# Patient Record
Sex: Female | Born: 1935 | Race: White | Hispanic: No
Health system: Southern US, Community
[De-identification: ages and names within clinical notes are randomized; demographics above are authoritative.]

---

## 2006-06-14 ENCOUNTER — Ambulatory Visit: Payer: Self-pay | Admitting: Internal Medicine

## 2007-02-17 ENCOUNTER — Emergency Department: Payer: Self-pay | Admitting: Internal Medicine

## 2007-02-17 ENCOUNTER — Emergency Department: Payer: Self-pay

## 2007-04-22 ENCOUNTER — Emergency Department: Payer: Self-pay | Admitting: Emergency Medicine

## 2007-05-19 ENCOUNTER — Emergency Department: Payer: Self-pay | Admitting: Emergency Medicine

## 2007-06-20 ENCOUNTER — Other Ambulatory Visit: Payer: Self-pay

## 2007-06-20 ENCOUNTER — Emergency Department: Payer: Self-pay | Admitting: Internal Medicine

## 2009-02-03 ENCOUNTER — Ambulatory Visit: Payer: Self-pay | Admitting: Internal Medicine

## 2009-10-02 ENCOUNTER — Emergency Department: Payer: Self-pay | Admitting: Emergency Medicine

## 2010-08-01 ENCOUNTER — Inpatient Hospital Stay: Payer: Self-pay | Admitting: Internal Medicine

## 2010-10-20 ENCOUNTER — Inpatient Hospital Stay: Payer: Self-pay | Admitting: Internal Medicine

## 2010-12-07 ENCOUNTER — Encounter: Payer: Self-pay | Admitting: Family Medicine

## 2010-12-17 ENCOUNTER — Encounter: Payer: Self-pay | Admitting: Family Medicine

## 2011-01-16 ENCOUNTER — Encounter: Payer: Self-pay | Admitting: Family Medicine

## 2011-01-21 ENCOUNTER — Encounter: Payer: Self-pay | Admitting: Family Medicine

## 2011-02-16 ENCOUNTER — Encounter: Payer: Self-pay | Admitting: Family Medicine

## 2011-03-02 ENCOUNTER — Ambulatory Visit: Payer: Self-pay | Admitting: Neurology

## 2011-03-15 ENCOUNTER — Emergency Department: Payer: Self-pay | Admitting: Emergency Medicine

## 2011-03-15 ENCOUNTER — Ambulatory Visit: Payer: Self-pay | Admitting: Unknown Physician Specialty

## 2011-03-17 ENCOUNTER — Other Ambulatory Visit: Payer: Self-pay | Admitting: Unknown Physician Specialty

## 2011-03-19 ENCOUNTER — Encounter: Payer: Self-pay | Admitting: Family Medicine

## 2011-03-19 ENCOUNTER — Ambulatory Visit: Payer: Self-pay | Admitting: Neurology

## 2011-11-22 ENCOUNTER — Ambulatory Visit: Payer: Self-pay | Admitting: Family Medicine

## 2011-11-24 ENCOUNTER — Ambulatory Visit: Payer: Self-pay | Admitting: Family Medicine

## 2012-05-21 ENCOUNTER — Ambulatory Visit: Payer: Self-pay | Admitting: Unknown Physician Specialty

## 2013-02-08 ENCOUNTER — Inpatient Hospital Stay: Payer: Self-pay | Admitting: Internal Medicine

## 2013-02-08 LAB — URINALYSIS, COMPLETE
Ketone: NEGATIVE
Leukocyte Esterase: NEGATIVE
RBC,UR: <1 /HPF (ref 0–5)
Squamous Epithelial: NONE SEEN
WBC UR: NONE SEEN /HPF (ref 0–5)

## 2013-02-08 LAB — CBC
HGB: 12.1 g/dL (ref 12.0–16.0)
MCH: 30.9 pg (ref 26.0–34.0)
MCV: 90 fL (ref 80–100)
Platelet: 202 10*3/uL (ref 150–440)
RBC: 3.91 10*6/uL (ref 3.80–5.20)
RDW: 14 % (ref 11.5–14.5)

## 2013-02-08 LAB — COMPREHENSIVE METABOLIC PANEL
Albumin: 3.3 g/dL — ABNORMAL LOW (ref 3.4–5.0)
Alkaline Phosphatase: 68 U/L (ref 50–136)
Anion Gap: 2 — ABNORMAL LOW (ref 7–16)
BUN: 6 mg/dL — ABNORMAL LOW (ref 7–18)
Co2: 34 mmol/L — ABNORMAL HIGH (ref 21–32)
EGFR (African American): >60
Osmolality: 273 (ref 275–301)
Potassium: 3.6 mmol/L (ref 3.5–5.1)
SGOT(AST): 19 U/L (ref 15–37)
SGPT (ALT): 14 U/L (ref 12–78)
Total Protein: 7.5 g/dL (ref 6.4–8.2)

## 2013-02-08 LAB — CLOSTRIDIUM DIFFICILE BY PCR

## 2013-02-08 LAB — PROTIME-INR: INR: 2.2

## 2013-02-09 LAB — CBC WITH DIFFERENTIAL/PLATELET
Basophil #: 0.1 10*3/uL (ref 0.0–0.1)
Eosinophil #: 0.5 10*3/uL (ref 0.0–0.7)
Eosinophil %: 4.2 %
HCT: 31.9 % — ABNORMAL LOW (ref 35.0–47.0)
HGB: 10.8 g/dL — ABNORMAL LOW (ref 12.0–16.0)
Lymphocyte #: 1.3 10*3/uL (ref 1.0–3.6)
Lymphocyte %: 10 %
MCH: 30.5 pg (ref 26.0–34.0)
MCHC: 33.8 g/dL (ref 32.0–36.0)
MCV: 90 fL (ref 80–100)
Neutrophil #: 9.5 10*3/uL — ABNORMAL HIGH (ref 1.4–6.5)
Neutrophil %: 76 %
Platelet: 184 10*3/uL (ref 150–440)
RDW: 13.8 % (ref 11.5–14.5)

## 2013-02-09 LAB — COMPREHENSIVE METABOLIC PANEL
Albumin: 2.7 g/dL — ABNORMAL LOW (ref 3.4–5.0)
Calcium, Total: 8.2 mg/dL — ABNORMAL LOW (ref 8.5–10.1)
Chloride: 104 mmol/L (ref 98–107)
EGFR (African American): >60
Glucose: 85 mg/dL (ref 65–99)
Osmolality: 276 (ref 275–301)
Potassium: 3.1 mmol/L — ABNORMAL LOW (ref 3.5–5.1)
SGOT(AST): 18 U/L (ref 15–37)
Sodium: 140 mmol/L (ref 136–145)
Total Protein: 6.3 g/dL — ABNORMAL LOW (ref 6.4–8.2)

## 2013-02-09 LAB — PROTIME-INR: Prothrombin Time: 23.3 secs — ABNORMAL HIGH (ref 11.5–14.7)

## 2013-02-09 LAB — MAGNESIUM: Magnesium: 1.5 mg/dL — ABNORMAL LOW

## 2013-02-10 LAB — BASIC METABOLIC PANEL WITH GFR
Anion Gap: 5 — ABNORMAL LOW
BUN: 8 mg/dL
Calcium, Total: 8 mg/dL — ABNORMAL LOW
Chloride: 104 mmol/L
Co2: 30 mmol/L
Creatinine: 0.79 mg/dL
EGFR (African American): >60
EGFR (Non-African Amer.): >60
Glucose: 114 mg/dL — ABNORMAL HIGH
Osmolality: 277
Potassium: 3 mmol/L — ABNORMAL LOW
Sodium: 139 mmol/L

## 2013-02-10 LAB — PROTIME-INR: INR: 2.2

## 2013-02-10 LAB — MAGNESIUM: Magnesium: 2.4 mg/dL

## 2013-02-11 LAB — BASIC METABOLIC PANEL
Anion Gap: 4 — ABNORMAL LOW (ref 7–16)
BUN: 10 mg/dL (ref 7–18)
Calcium, Total: 8.1 mg/dL — ABNORMAL LOW (ref 8.5–10.1)
Chloride: 107 mmol/L (ref 98–107)
EGFR (African American): >60
Osmolality: 281 (ref 275–301)
Potassium: 3.5 mmol/L (ref 3.5–5.1)
Sodium: 141 mmol/L (ref 136–145)

## 2013-02-11 LAB — PROTIME-INR
INR: 1.9
Prothrombin Time: 21.8 secs — ABNORMAL HIGH (ref 11.5–14.7)

## 2013-02-11 LAB — CBC WITH DIFFERENTIAL/PLATELET
Basophil %: 0.5 %
Eosinophil #: 0.1 10*3/uL (ref 0.0–0.7)
Eosinophil %: 0.8 %
HCT: 29.2 % — ABNORMAL LOW (ref 35.0–47.0)
HGB: 10.1 g/dL — ABNORMAL LOW (ref 12.0–16.0)
MCHC: 34.6 g/dL (ref 32.0–36.0)
MCV: 90 fL (ref 80–100)
Monocyte #: 1.5 x10 3/mm — ABNORMAL HIGH (ref 0.2–0.9)
Neutrophil #: 9.2 10*3/uL — ABNORMAL HIGH (ref 1.4–6.5)
Neutrophil %: 77.6 %
Platelet: 178 10*3/uL (ref 150–440)
RBC: 3.24 10*6/uL — ABNORMAL LOW (ref 3.80–5.20)
RDW: 14.1 % (ref 11.5–14.5)

## 2013-02-11 LAB — WBCS, STOOL

## 2013-02-12 LAB — BASIC METABOLIC PANEL
Anion Gap: 3 — ABNORMAL LOW (ref 7–16)
BUN: 10 mg/dL (ref 7–18)
Calcium, Total: 8.3 mg/dL — ABNORMAL LOW (ref 8.5–10.1)
Chloride: 105 mmol/L (ref 98–107)
Co2: 31 mmol/L (ref 21–32)
Creatinine: 0.61 mg/dL (ref 0.60–1.30)
EGFR (African American): >60
EGFR (Non-African Amer.): >60
Glucose: 108 mg/dL — ABNORMAL HIGH (ref 65–99)
Osmolality: 277 (ref 275–301)
Potassium: 3.1 mmol/L — ABNORMAL LOW (ref 3.5–5.1)

## 2013-02-12 LAB — PROTIME-INR: Prothrombin Time: 26.1 secs — ABNORMAL HIGH (ref 11.5–14.7)

## 2013-02-13 LAB — BASIC METABOLIC PANEL
Calcium, Total: 8.4 mg/dL — ABNORMAL LOW (ref 8.5–10.1)
Co2: 29 mmol/L (ref 21–32)
Creatinine: 0.62 mg/dL (ref 0.60–1.30)
EGFR (Non-African Amer.): >60
Osmolality: 277 (ref 275–301)
Potassium: 3.1 mmol/L — ABNORMAL LOW (ref 3.5–5.1)

## 2013-02-13 LAB — PROTIME-INR
INR: 2.4
Prothrombin Time: 25.4 secs — ABNORMAL HIGH (ref 11.5–14.7)

## 2013-03-08 ENCOUNTER — Emergency Department: Payer: Self-pay | Admitting: Unknown Physician Specialty

## 2013-03-08 LAB — COMPREHENSIVE METABOLIC PANEL
Albumin: 3.5 g/dL (ref 3.4–5.0)
Alkaline Phosphatase: 67 U/L (ref 50–136)
Anion Gap: 6 — ABNORMAL LOW (ref 7–16)
Calcium, Total: 9 mg/dL (ref 8.5–10.1)
Co2: 26 mmol/L (ref 21–32)
EGFR (African American): >60
EGFR (Non-African Amer.): >60
Glucose: 91 mg/dL (ref 65–99)
Potassium: 3.6 mmol/L (ref 3.5–5.1)
SGPT (ALT): 21 U/L (ref 12–78)
Sodium: 137 mmol/L (ref 136–145)
Total Protein: 7.3 g/dL (ref 6.4–8.2)

## 2013-03-08 LAB — CBC
HCT: 36.5 % (ref 35.0–47.0)
HGB: 12.4 g/dL (ref 12.0–16.0)
MCH: 30.7 pg (ref 26.0–34.0)
MCHC: 33.9 g/dL (ref 32.0–36.0)
MCV: 91 fL (ref 80–100)
Platelet: 185 10*3/uL (ref 150–440)
RBC: 4.03 10*6/uL (ref 3.80–5.20)
RDW: 14 % (ref 11.5–14.5)

## 2013-03-08 LAB — CK TOTAL AND CKMB (NOT AT ARMC): CK, Total: 34 U/L (ref 21–215)

## 2014-01-15 ENCOUNTER — Ambulatory Visit: Payer: Self-pay | Admitting: Internal Medicine

## 2014-01-18 ENCOUNTER — Inpatient Hospital Stay: Payer: Self-pay | Admitting: Internal Medicine

## 2014-01-18 LAB — URINALYSIS, COMPLETE
Bilirubin,UR: NEGATIVE
Glucose,UR: NEGATIVE mg/dL (ref 0–75)
Ketone: NEGATIVE
Leukocyte Esterase: NEGATIVE
Nitrite: NEGATIVE
PH: 5 (ref 4.5–8.0)
PROTEIN: NEGATIVE
SPECIFIC GRAVITY: 1.025 (ref 1.003–1.030)

## 2014-01-18 LAB — CBC WITH DIFFERENTIAL/PLATELET
Basophil #: 0.1 10*3/uL (ref 0.0–0.1)
Basophil %: 0.8 %
EOS ABS: 0.3 10*3/uL (ref 0.0–0.7)
EOS PCT: 2.1 %
HCT: 35.4 % (ref 35.0–47.0)
HGB: 11.8 g/dL — ABNORMAL LOW (ref 12.0–16.0)
LYMPHS PCT: 3.8 %
Lymphocyte #: 0.6 10*3/uL — ABNORMAL LOW (ref 1.0–3.6)
MCH: 31.2 pg (ref 26.0–34.0)
MCHC: 33.3 g/dL (ref 32.0–36.0)
MCV: 94 fL (ref 80–100)
MONO ABS: 1 x10 3/mm — AB (ref 0.2–0.9)
Monocyte %: 6.3 %
NEUTROS PCT: 87 %
Neutrophil #: 13.5 10*3/uL — ABNORMAL HIGH (ref 1.4–6.5)
PLATELETS: 157 10*3/uL (ref 150–440)
RBC: 3.78 10*6/uL — AB (ref 3.80–5.20)
RDW: 13.1 % (ref 11.5–14.5)
WBC: 15.5 10*3/uL — ABNORMAL HIGH (ref 3.6–11.0)

## 2014-01-18 LAB — TROPONIN I

## 2014-01-18 LAB — COMPREHENSIVE METABOLIC PANEL
ALT: 18 U/L (ref 12–78)
AST: 29 U/L (ref 15–37)
Albumin: 3.2 g/dL — ABNORMAL LOW (ref 3.4–5.0)
Alkaline Phosphatase: 66 U/L
Anion Gap: 10 (ref 7–16)
BUN: 12 mg/dL (ref 7–18)
Bilirubin,Total: 1 mg/dL (ref 0.2–1.0)
CHLORIDE: 101 mmol/L (ref 98–107)
Calcium, Total: 8.2 mg/dL — ABNORMAL LOW (ref 8.5–10.1)
Co2: 28 mmol/L (ref 21–32)
Creatinine: 0.7 mg/dL (ref 0.60–1.30)
EGFR (African American): >60
GLUCOSE: 142 mg/dL — AB (ref 65–99)
Osmolality: 280 (ref 275–301)
Potassium: 3.3 mmol/L — ABNORMAL LOW (ref 3.5–5.1)
SODIUM: 139 mmol/L (ref 136–145)
Total Protein: 6.7 g/dL (ref 6.4–8.2)

## 2014-01-18 LAB — LIPASE, BLOOD: LIPASE: 65 U/L — AB (ref 73–393)

## 2014-01-18 LAB — MAGNESIUM: Magnesium: 1.7 mg/dL — ABNORMAL LOW

## 2014-01-18 LAB — PROTIME-INR
INR: 2.2
PROTHROMBIN TIME: 23.7 s — AB (ref 11.5–14.7)

## 2014-01-18 LAB — ACETAMINOPHEN LEVEL: ACETAMINOPHEN: 18 ug/mL

## 2014-01-19 LAB — URINE CULTURE

## 2014-01-19 LAB — BASIC METABOLIC PANEL
ANION GAP: 8 (ref 7–16)
BUN: 11 mg/dL (ref 7–18)
CHLORIDE: 102 mmol/L (ref 98–107)
CREATININE: 0.79 mg/dL (ref 0.60–1.30)
Calcium, Total: 8.4 mg/dL — ABNORMAL LOW (ref 8.5–10.1)
Co2: 28 mmol/L (ref 21–32)
EGFR (African American): >60
GLUCOSE: 98 mg/dL (ref 65–99)
OSMOLALITY: 275 (ref 275–301)
POTASSIUM: 3.4 mmol/L — AB (ref 3.5–5.1)
SODIUM: 138 mmol/L (ref 136–145)

## 2014-01-19 LAB — PROTIME-INR
INR: 1.7
PROTHROMBIN TIME: 19.9 s — AB (ref 11.5–14.7)

## 2014-01-19 LAB — CBC WITH DIFFERENTIAL/PLATELET
Basophil #: 0.2 10*3/uL — ABNORMAL HIGH (ref 0.0–0.1)
Basophil %: 1.7 %
EOS ABS: 0 10*3/uL (ref 0.0–0.7)
Eosinophil %: 0.1 %
HCT: 34.3 % — ABNORMAL LOW (ref 35.0–47.0)
HGB: 11.5 g/dL — AB (ref 12.0–16.0)
LYMPHS ABS: 0.9 10*3/uL — AB (ref 1.0–3.6)
LYMPHS PCT: 6.8 %
MCH: 31.6 pg (ref 26.0–34.0)
MCHC: 33.4 g/dL (ref 32.0–36.0)
MCV: 95 fL (ref 80–100)
Monocyte #: 1 x10 3/mm — ABNORMAL HIGH (ref 0.2–0.9)
Monocyte %: 7.8 %
Neutrophil #: 10.8 10*3/uL — ABNORMAL HIGH (ref 1.4–6.5)
Neutrophil %: 83.6 %
Platelet: 164 10*3/uL (ref 150–440)
RBC: 3.63 10*6/uL — ABNORMAL LOW (ref 3.80–5.20)
RDW: 13.2 % (ref 11.5–14.5)
WBC: 12.8 10*3/uL — ABNORMAL HIGH (ref 3.6–11.0)

## 2014-01-19 LAB — DIGOXIN LEVEL: Digoxin: 0.9 ng/mL

## 2014-01-20 LAB — BASIC METABOLIC PANEL
Anion Gap: 7 (ref 7–16)
BUN: 21 mg/dL — ABNORMAL HIGH (ref 7–18)
CO2: 28 mmol/L (ref 21–32)
CREATININE: 0.94 mg/dL (ref 0.60–1.30)
Calcium, Total: 8.5 mg/dL (ref 8.5–10.1)
Chloride: 101 mmol/L (ref 98–107)
EGFR (African American): >60
GFR CALC NON AF AMER: 54 — AB
Glucose: 116 mg/dL — ABNORMAL HIGH (ref 65–99)
OSMOLALITY: 276 (ref 275–301)
Potassium: 3.3 mmol/L — ABNORMAL LOW (ref 3.5–5.1)
Sodium: 136 mmol/L (ref 136–145)

## 2014-01-20 LAB — PROTIME-INR
INR: 1.9
PROTHROMBIN TIME: 21.7 s — AB (ref 11.5–14.7)

## 2014-01-21 LAB — BASIC METABOLIC PANEL
ANION GAP: 9 (ref 7–16)
BUN: 20 mg/dL — AB (ref 7–18)
CREATININE: 1.27 mg/dL (ref 0.60–1.30)
Calcium, Total: 8.1 mg/dL — ABNORMAL LOW (ref 8.5–10.1)
Chloride: 99 mmol/L (ref 98–107)
Co2: 26 mmol/L (ref 21–32)
GFR CALC AF AMER: 44 — AB
GFR CALC NON AF AMER: 38 — AB
GLUCOSE: 98 mg/dL (ref 65–99)
Osmolality: 271 (ref 275–301)
POTASSIUM: 3.3 mmol/L — AB (ref 3.5–5.1)
Sodium: 134 mmol/L — ABNORMAL LOW (ref 136–145)

## 2014-01-21 LAB — TSH: Thyroid Stimulating Horm: 1.26 u[IU]/mL

## 2014-01-21 LAB — PROTIME-INR
INR: 2.5
Prothrombin Time: 26.5 secs — ABNORMAL HIGH (ref 11.5–14.7)

## 2014-01-21 LAB — MAGNESIUM: Magnesium: 2 mg/dL

## 2014-01-21 LAB — VANCOMYCIN, TROUGH: Vancomycin, Trough: 13 ug/mL (ref 10–20)

## 2014-01-22 LAB — BASIC METABOLIC PANEL
ANION GAP: 9 (ref 7–16)
BUN: 30 mg/dL — ABNORMAL HIGH (ref 7–18)
CALCIUM: 8.5 mg/dL (ref 8.5–10.1)
CREATININE: 1.39 mg/dL — AB (ref 0.60–1.30)
Chloride: 98 mmol/L (ref 98–107)
Co2: 26 mmol/L (ref 21–32)
EGFR (African American): 39 — ABNORMAL LOW
EGFR (Non-African Amer.): 34 — ABNORMAL LOW
GLUCOSE: 100 mg/dL — AB (ref 65–99)
Osmolality: 273 (ref 275–301)
POTASSIUM: 3.2 mmol/L — AB (ref 3.5–5.1)
SODIUM: 133 mmol/L — AB (ref 136–145)

## 2014-01-22 LAB — PROTIME-INR
INR: 2.8
PROTHROMBIN TIME: 29 s — AB (ref 11.5–14.7)

## 2014-01-23 LAB — HEMOGLOBIN: HGB: 9.7 g/dL — ABNORMAL LOW (ref 12.0–16.0)

## 2014-01-23 LAB — BASIC METABOLIC PANEL
ANION GAP: 8 (ref 7–16)
BUN: 21 mg/dL — ABNORMAL HIGH (ref 7–18)
CHLORIDE: 103 mmol/L (ref 98–107)
CREATININE: 1.33 mg/dL — AB (ref 0.60–1.30)
Calcium, Total: 8.1 mg/dL — ABNORMAL LOW (ref 8.5–10.1)
Co2: 26 mmol/L (ref 21–32)
GFR CALC AF AMER: 41 — AB
GFR CALC NON AF AMER: 36 — AB
Glucose: 82 mg/dL (ref 65–99)
Osmolality: 276 (ref 275–301)
Potassium: 3.1 mmol/L — ABNORMAL LOW (ref 3.5–5.1)
SODIUM: 137 mmol/L (ref 136–145)

## 2014-01-23 LAB — PROTIME-INR
INR: 4
PROTHROMBIN TIME: 37.7 s — AB (ref 11.5–14.7)

## 2014-01-23 LAB — CULTURE, BLOOD (SINGLE)

## 2014-01-23 LAB — VANCOMYCIN, TROUGH: Vancomycin, Trough: 20 ug/mL (ref 10–20)

## 2014-01-24 LAB — CBC WITH DIFFERENTIAL/PLATELET
BASOS ABS: 0.1 10*3/uL (ref 0.0–0.1)
Basophil %: 0.8 %
EOS PCT: 3.8 %
Eosinophil #: 0.4 10*3/uL (ref 0.0–0.7)
HCT: 29.6 % — AB (ref 35.0–47.0)
HGB: 9.9 g/dL — AB (ref 12.0–16.0)
LYMPHS ABS: 1.2 10*3/uL (ref 1.0–3.6)
Lymphocyte %: 11.2 %
MCH: 32 pg (ref 26.0–34.0)
MCHC: 33.3 g/dL (ref 32.0–36.0)
MCV: 96 fL (ref 80–100)
Monocyte #: 1 x10 3/mm — ABNORMAL HIGH (ref 0.2–0.9)
Monocyte %: 9.9 %
NEUTROS ABS: 7.7 10*3/uL — AB (ref 1.4–6.5)
NEUTROS PCT: 74.3 %
Platelet: 284 10*3/uL (ref 150–440)
RBC: 3.08 10*6/uL — AB (ref 3.80–5.20)
RDW: 13.4 % (ref 11.5–14.5)
WBC: 10.4 10*3/uL (ref 3.6–11.0)

## 2014-01-24 LAB — PROTIME-INR
INR: 4.6
Prothrombin Time: 42.1 secs — ABNORMAL HIGH (ref 11.5–14.7)

## 2014-01-24 LAB — BASIC METABOLIC PANEL
ANION GAP: 7 (ref 7–16)
BUN: 18 mg/dL (ref 7–18)
CO2: 27 mmol/L (ref 21–32)
Calcium, Total: 8.2 mg/dL — ABNORMAL LOW (ref 8.5–10.1)
Chloride: 104 mmol/L (ref 98–107)
Creatinine: 1.29 mg/dL (ref 0.60–1.30)
EGFR (African American): 43 — ABNORMAL LOW
EGFR (Non-African Amer.): 37 — ABNORMAL LOW
GLUCOSE: 90 mg/dL (ref 65–99)
OSMOLALITY: 277 (ref 275–301)
Potassium: 3.5 mmol/L (ref 3.5–5.1)
Sodium: 138 mmol/L (ref 136–145)

## 2014-01-25 LAB — BASIC METABOLIC PANEL
Anion Gap: 7 (ref 7–16)
BUN: 15 mg/dL (ref 7–18)
CALCIUM: 8.1 mg/dL — AB (ref 8.5–10.1)
CHLORIDE: 106 mmol/L (ref 98–107)
CREATININE: 1.15 mg/dL (ref 0.60–1.30)
Co2: 26 mmol/L (ref 21–32)
EGFR (Non-African Amer.): 42 — ABNORMAL LOW
GFR CALC AF AMER: 49 — AB
GLUCOSE: 79 mg/dL (ref 65–99)
OSMOLALITY: 277 (ref 275–301)
Potassium: 3.5 mmol/L (ref 3.5–5.1)
SODIUM: 139 mmol/L (ref 136–145)

## 2014-01-25 LAB — MAGNESIUM: Magnesium: 2.2 mg/dL

## 2014-01-25 LAB — PROTIME-INR
INR: 2.9
Prothrombin Time: 29.5 secs — ABNORMAL HIGH (ref 11.5–14.7)

## 2014-01-26 LAB — PROTIME-INR
INR: 2.6
Prothrombin Time: 27 secs — ABNORMAL HIGH (ref 11.5–14.7)

## 2014-01-27 LAB — BASIC METABOLIC PANEL
ANION GAP: 7 (ref 7–16)
BUN: 11 mg/dL (ref 7–18)
Calcium, Total: 8.2 mg/dL — ABNORMAL LOW (ref 8.5–10.1)
Chloride: 104 mmol/L (ref 98–107)
Co2: 27 mmol/L (ref 21–32)
Creatinine: 1.19 mg/dL (ref 0.60–1.30)
EGFR (Non-African Amer.): 41 — ABNORMAL LOW
GFR CALC AF AMER: 47 — AB
Glucose: 110 mg/dL — ABNORMAL HIGH (ref 65–99)
OSMOLALITY: 276 (ref 275–301)
Potassium: 4.3 mmol/L (ref 3.5–5.1)
SODIUM: 138 mmol/L (ref 136–145)

## 2014-01-27 LAB — CK TOTAL AND CKMB (NOT AT ARMC)
CK, Total: 20 U/L — ABNORMAL LOW
CK-MB: 1 ng/mL (ref 0.5–3.6)

## 2014-01-27 LAB — CBC WITH DIFFERENTIAL/PLATELET
BASOS PCT: 0.6 %
Basophil #: 0.1 10*3/uL (ref 0.0–0.1)
EOS ABS: 0.2 10*3/uL (ref 0.0–0.7)
EOS PCT: 1.4 %
HCT: 31.7 % — ABNORMAL LOW (ref 35.0–47.0)
HGB: 10.2 g/dL — AB (ref 12.0–16.0)
LYMPHS ABS: 0.8 10*3/uL — AB (ref 1.0–3.6)
Lymphocyte %: 5.4 %
MCH: 30.8 pg (ref 26.0–34.0)
MCHC: 32.2 g/dL (ref 32.0–36.0)
MCV: 96 fL (ref 80–100)
Monocyte #: 0.7 x10 3/mm (ref 0.2–0.9)
Monocyte %: 4.9 %
Neutrophil #: 12.3 10*3/uL — ABNORMAL HIGH (ref 1.4–6.5)
Neutrophil %: 87.7 %
PLATELETS: 370 10*3/uL (ref 150–440)
RBC: 3.32 10*6/uL — ABNORMAL LOW (ref 3.80–5.20)
RDW: 13.5 % (ref 11.5–14.5)
WBC: 14 10*3/uL — AB (ref 3.6–11.0)

## 2014-01-27 LAB — PROTIME-INR
INR: 2.2
Prothrombin Time: 23.5 secs — ABNORMAL HIGH (ref 11.5–14.7)

## 2014-01-28 LAB — PROTIME-INR
INR: 2
Prothrombin Time: 22.1 secs — ABNORMAL HIGH (ref 11.5–14.7)

## 2014-01-29 LAB — CBC WITH DIFFERENTIAL/PLATELET
Basophil #: 0.1 10*3/uL (ref 0.0–0.1)
Basophil %: 0.6 %
EOS ABS: 0.1 10*3/uL (ref 0.0–0.7)
Eosinophil %: 0.8 %
HCT: 31.2 % — ABNORMAL LOW (ref 35.0–47.0)
HGB: 10 g/dL — ABNORMAL LOW (ref 12.0–16.0)
Lymphocyte #: 0.7 10*3/uL — ABNORMAL LOW (ref 1.0–3.6)
Lymphocyte %: 5 %
MCH: 30.6 pg (ref 26.0–34.0)
MCHC: 32.2 g/dL (ref 32.0–36.0)
MCV: 95 fL (ref 80–100)
MONOS PCT: 5.4 %
Monocyte #: 0.8 x10 3/mm (ref 0.2–0.9)
NEUTROS ABS: 12.4 10*3/uL — AB (ref 1.4–6.5)
Neutrophil %: 88.2 %
Platelet: 383 10*3/uL (ref 150–440)
RBC: 3.28 10*6/uL — ABNORMAL LOW (ref 3.80–5.20)
RDW: 14.1 % (ref 11.5–14.5)
WBC: 14 10*3/uL — AB (ref 3.6–11.0)

## 2014-01-29 LAB — PROTIME-INR
INR: 2.2
Prothrombin Time: 24.2 secs — ABNORMAL HIGH (ref 11.5–14.7)

## 2014-01-29 LAB — CREATININE, SERUM
CREATININE: 1.12 mg/dL (ref 0.60–1.30)
EGFR (Non-African Amer.): 44 — ABNORMAL LOW
GFR CALC AF AMER: 51 — AB

## 2014-01-30 LAB — PROTIME-INR
INR: 2.8
Prothrombin Time: 29.1 secs — ABNORMAL HIGH (ref 11.5–14.7)

## 2014-02-15 ENCOUNTER — Ambulatory Visit: Payer: Self-pay | Admitting: Internal Medicine

## 2014-02-15 DEATH — deceased

## 2014-11-07 NOTE — Discharge Summary (Signed)
PATIENT NAME:  Brooke Brooks, Brooke Brooks MR#:  [id number] DATE OF BIRTH:  [birth year]  DATE OF ADMISSION:  02/08/2013 DATE OF DISCHARGE:  02/13/2013  DISCHARGE DIAGNOSES:  1. Diverticulitis.  2. Acute on chronic systolic congestive heart failure.  3. Chronic atrial fibrillation, on Coumadin.  4. Hypertension.  5. Electrolyte imbalance, hypokalemia.  6. Pneumonia.  7. Weakness.  8. History of stroke.   CODE STATUS: FULL CODE.   CONDITION ON DISCHARGE: Stable.   MEDICATIONS ON DISCHARGE:  1. Protonix 40 mg oral enteric coated once a day.  2. Simvastatin 20 mg once a day.  3. Trazodone 100 mg 1.5 once a day.  4. Furosemide 20 mg once a day.  5. Digoxin 250 mcg oral tablet once a day.  6. Ativan 0.5 mg 4 times a day.  7. Warfarin 2.5 mg once a day.  8. Coreg 25 mg 2 times a day.  9. Losartan 50 mg once a day.  10. Invanz 1 g IV every 24 hours for 4 days.  11. Erythromycin 250 mg oral tablet once a day for 3 days.  12. Potassium chloride 10 mEq oral tablet 2 times a day.   DIET ON DISCHARGE: Low-sodium. Advised to have clear Ensure 2 times a day, regular consistency.   TIMEFRAME TO FOLLOWUP: Within 1 to 2 weeks. Advised to follow with PMD and GI with Dr. Lutricia FeilPaul Oh and Dr. Tera MaterJim Hedrick.   HISTORY OF PRESENT ILLNESS: An 81 year old female with a history of ischemic cardiomyopathy with an ejection fraction of 35% to 45%, hypertension, coronary artery disease, fibromyalgia, severe diet intolerance. Came with a history of diverticulitis that started almost 2 weeks ago. She went to gastroenterology office and was prescribed Augmentin. She took 10 days of therapy. She completed it but still had significant symptoms, so decided to come to the ER. Had 10 out of 10 abdominal pain which was throbbing, constant and cramp-like. She still had diarrhea, so she was admitted for failure of oral therapy for need of IV antibiotic therapy for diverticulitis.   HOSPITAL COURSE AND STAY:   1. Diverticulitis: Failed  outpatient therapy. IV Pincus Sanesnvanz was started. PICC line was placed, and the patient discharged home.  2. Chronic CHF: The patient was initially not having any exacerbation of symptoms, but as IV fluids were given for her diarrhea and diverticulitis, she started having acute exacerbation, so Lasix was restarted and the patient felt better.  3. Acute respiratory failure: The patient became a little short of breath. On chest x-ray, there was evidence of pulmonary edema versus infiltrate, so erythromycin was started to cover atypical organisms on top of ertapenem that she was receiving for diverticulitis.  4. Atrial fibrillation which was chronic: She was on carvedilol, Coumadin and digoxin. We continued and remained under control.  5. Hypertension: Remained under control with carvedilol and losartan.  6. Electrolyte imbalance: Hypokalemia and hypomagnesemia which were corrected IV.  7. History of stroke: We continued Coumadin.   IMPORTANT LABORATORY RESULTS IN THE HOSPITAL: Creatinine was 0.79 on admission. There were no WBCs in the stool. INR was 1.9. C. diff stool study was negative.   TOTAL TIME SPENT ON THIS DISCHARGE: 45 minutes.   ____________________________ Hope PigeonVaibhavkumar G. Elisabeth PigeonVachhani, MD vgv:gb D: 02/17/2013 22:07:31 ET T: 02/18/2013 01:53:38 ET JOB#: [id number]  cc: Hope PigeonVaibhavkumar G. Elisabeth PigeonVachhani, MD, <Dictator> Ezzard StandingPaul Y. Bluford Kaufmannh, MD Rhona LeavensJames F. Burnett ShengHedrick, MD  Altamese DillingVAIBHAVKUMAR Khyli Swaim MD ELECTRONICALLY SIGNED 02/28/2013 1:29

## 2014-11-07 NOTE — Consult Note (Signed)
Pt seen and examined. Full consult to follow. Pt of Dr. Earnest ConroyElliott's. Treated for possible diverticulitis at home with augmentin. Initially felt better. However, after being off for few days, started to have more abd pain. No fever. Chronic diarrhea. Admitted on Fri on IV Abx. Pt allergic to cipro. Little better. No bleeding. WBC coming down. Low abd tenderness. CT shows sigmoid colitis. Pt not interested in having colonoscopy or flex sigmoidoscopy at this time even if sxs persist.Continue IV Abx. Interested in going home on PICC line/IV Abx with  home care service. If continues to feel better as WBC iimproving, reasonable to arrange that tomorrow. Dr. Mechele CollinElliott is out until Thurs. I will be out this week starting tomorrow. So, will have Dr. Shelle Ironein see patient tomorrow. Thanks.  Electronic Signatures: Lutricia Feilh, Macky Galik (MD)  (Signed on 27-Jul-14 10:19)  Authored  Last Updated: 27-Jul-14 10:19 by Lutricia Feilh, Athena Baltz (MD)

## 2014-11-07 NOTE — Consult Note (Signed)
PATIENT NAME:  Brooke Brooks, Brooke Brooks MR#:  [id number] DATE OF BIRTH:  09/28/1923  DATE OF CONSULTATION:  02/09/2013  CONSULTING PHYSICIAN:  Ezzard Standing. Bluford Kaufmann, MD  REASON FOR REFERRAL:  Diverticulitis.   HISTORY OF PRESENT ILLNESS:  The patient is an 81 year old white female with a known history of cardiomyopathy, hypertension, coronary artery disease who also has a history of stroke in the past. She was seen by Dr. Earnest Conroy office recently for possible diverticulitis and was given a 10-day course of Augmentin with relief. SHE IS ALLERGIC TO CIPRO. After she finished the Augmentin, she started having recurrent symptoms again with increasing abdominal pain but no fevers or chills. As a result, Dr. Mechele Collin told her to come into the Emergency Room for evaluation. It was decided that she will be given intravenous antibiotics for better coverage.   The patient was started on IV and Invanz. I was consulted yesterday to see the patient is morning. The patient is starting to feel better. She has chronic diarrhea which has not gotten any worse with her current bout of diverticulitis, but she does have some urinary and fecal incontinence as well.   There is less pain. She had a CT scan done that showed sigmoid colitis.  REVIEW OF SYMPTOMS:  There are no fevers or chills. There are no weight changes. There are no visual or hearing changes. There is no chest pain or palpitation. No cough. No shortness of breath. GI symptoms have been described already. There is no nausea, vomiting. No heartburn. There is no gross hematochezia or melena. The rest of the review of symptoms is noncontributory.   PAST MEDICAL HISTORY:  Notable for a history of CVA with left-sided weakness. She does have history of anxiety. She has ischemic cardiomyopathy with ejection fraction of 35% to 45%. Other history includes fibromyalgia, mitral valve prolapse, arthritis and COPD as well as atrial fibrillation. She is on chronic anticoagulation.   PAST  SURGICAL HISTORY:  Include thyroidectomy, hysterectomy cataract surgery.   ALLERGIES: SHE IS ALLERGIC TO CIPRO AND SULFA. SHE IS ALSO LACTOSE INTOLERANT.   SOCIAL HISTORY:  She does not smoke or drink. She lives with her daughter.   FAMILY HISTORY:  Notable for hypertension.   MEDICATIONS AT HOME:  Include: Coumadin daily, (Dictation Anomaly)<<MISSING TEST>>>, Zocor, Protonix, losartan, Lomotil as needed, Lasix, folic acid, digoxin, Coreg twice a day, Ativan, Tylenol.   PHYSICAL EXAMINATION: GENERAL: The patient appears to be in no acute distress. She is elderly and frail looking.  VITAL SIGNS: She is afebrile. Vital signs are stable.  CARDIAC:  Irregularly irregular rate. I did not appreciate any murmurs.  LUNGS: Clear bilaterally.  ABDOMEN: Soft. There is no hepatomegaly. There is some mild tenderness in the lower abdomen, more so on the left side.  EXTREMITIES: No clubbing, cyanosis, or edema.  NEUROLOGIC: Some left-sided weakness from CVA.  SKIN: Negative.   ASSESSMENT AND PLAN: This is a patient with persistent and recurrent abdominal pain associated with colitis seen on CT scan. She probably has persistent diverticulitis which is improving with IV antibiotics. Other possibilities include infectious colitis or ischemic colitis. I did discuss the possibility of doing a colonoscopy or a flexible sigmoidoscopy if her symptoms persist. However, at this time the patient is unwilling to agree to a procedure at this time because of her age and other comorbid illnesses.   The patient and the daughter are interested in having the patient go home soon with IV antibiotics with home care service. PICC line needs  to be placed first. If home health care can be arranged, perhaps she can even be discharged tomorrow with a full 10-day course of antibiotics at home. Certainly she needs to follow up with Dr. Mechele CollinElliott. We need to make sure the white blood cell count continues to improve before she is  discharged. If for some reason she is not able to go home, than Dr. Alycia Rossettiyan will see the patient tomorrow.   Thank you for the referral.   ____________________________ Ezzard StandingPaul Y. Bluford Kaufmannh, MD pyo:NTS D: 02/10/2013 12:38:07 ET T: 02/10/2013 13:41:12 ET JOB#: [id number]  cc: Ezzard StandingPaul Y. Bluford Kaufmannh, MD, <Dictator> Ezzard StandingPAUL Y Maurine Mowbray MD ELECTRONICALLY SIGNED 02/20/2013 8:34

## 2014-11-07 NOTE — H&P (Signed)
PATIENT NAME:  Brooke Brooks, Brooke Brooks MR#:  [id number] DATE OF BIRTH:  [birth year]  DATE OF ADMISSION:  02/08/2013  REFERRING PHYSICIAN: Cecille Amsterdam. Mayford Knife, MD  PRIMARY CARE PHYSICIAN: Mickel Crow, MD   GASTROENTEROLOGIST: Scot Jun, MD  CHIEF COMPLAINT: Left lower quadrant abdominal pain.   HISTORY OF PRESENT ILLNESS: This is a very nice 81 year old female who has a history of ischemic cardiomyopathy with an ejection fraction of 35% to 45%, hypertension, coronary artery disease, fibromyalgia, severe diet intolerance including wheat, citrus, lactose. She had a previous stroke,  anxiety. Comes in today with a history of diverticulitis that started about a month ago. She waited 2 weeks to consult to her gastroenterologist. Once she consulted, she was treated with Augmentin. Prescription was given for 10 days. She completed 10 days and still has significant symptoms, for what she is admitted for failure to treat diverticulitis as an outpatient. Her pain is located in the left lower quadrant, progressing to the right lower quadrant within the last week. Her pain intensity was 10 out of 10 at the beginning, has improved to 6 out of 10 within the last week, sore, throbbing, constant with increased gas production, cramping-like. No better with movement or bowel movements. There is nothing that really makes it better or anything that makes it worse. It is just constant at the same rate. The patient denies any fever. Denies any significant changes in her stool habits, which is always loose stool, and she has chronic urinary and fecal incontinence. The patient denies any fever, chills, changes in weight or other problems. The patient was evaluated in the ER where she had a CT scan of the abdomen showing sigmoid colon colitis with right perirectal lymphadenopathy; recommended followup with colonoscopy.   REVIEW OF SYSTEMS: A 12-system review of systems was done. CONSTITUTIONAL: Denies any fever, fatigue,  weakness, weight loss or weight gain.  EYES: No blurry vision, double vision. The patient had cataract surgery.  EARS, NOSE, THROAT: No tinnitus. No difficulty swallowing. No epistaxis. No sinus pain.  RESPIRATORY: No COPD. No shortness of breath. No cough. No wheezing.  CARDIOVASCULAR: No chest pain, orthopnea or paroxysmal nocturnal dyspnea. The patient has history of CHF, systolic, which is well-compensated.  GASTROINTESTINAL: Positive loose bowel movements all the time since her stroke. No blood in stool. Positive abdominal pain, left lower quadrant. GENITOURINARY: No dysuria or hematuria. Positive chronic incontinence. No changes in urinary frequency. GYNECOLOGIC: No breast masses.  ENDOCRINE: No polyuria, polydipsia, polyphagia, cold or heat intolerance.  HEMATOLOGIC AND LYMPHATIC: No anemia, easy bruising or swollen glands.  MUSCULOSKELETAL: No significant neck pain or back pain. No gout.  NEUROLOGIC: Previous CVA. No numbness or tingling at this moment.  PSYCHIATRIC: Positive for anxiety.   PAST MEDICAL HISTORY: 1.  CVA with left-sided weakness.  2.  Anxiety.  3.  Hyperlipidemia.  4.  CHF, systolic, with ejection fraction of 35% to 45%. 5.  Coronary artery disease.  6.  Status post non-ST elevation MI.  7.  Hypertension.  8.  Peptic ulcer disease.  9.  Fibromyalgia.  10.  Mitral valve prolapse.  11.  Osteoarthritis.  12.  COPD.  13.  Left bundle branch block.  14.  History of diverticulitis.  15.  Atrial fibrillation.  16.  Chronic anticoagulation.   PAST SURGICAL HISTORY:  1.  Thyroidectomy.  2.  Tonsillectomy.  3.  Hysterectomy.  4.  Cataract surgery.   ALLERGIES: THE PATIENT IS ALLERGIC TO CIPRO, SULFAS. SHE IS ALLERGIC TO LACTOSE.  NO WHEAT IN THE  DIET, NO DAIRY PRODUCTS, NO SPICY FOODS. NO CITRUS. THEY ALL CAUSE HER TO HAVE INTOLERANCE.   SOCIAL HISTORY: The patient lives with her daughter. She does not smoke, does not drink.   FAMILY HISTORY: Positive for  hypertension in her parents.   CURRENT MEDICATIONS: Include Coumadin 2.5 mg daily, trazodone 100 mg take 1-1/2 tablets once a day, simvastatin 20 mg daily, Protonix 40 mg once a day, losartan 50 mg twice daily, Lomotil 2.5/0.025 as needed for diarrhea, furosemide 20 mg once a day, folic acid 1 mg once daily, digoxin 250 mcg once a day, Coreg 25 mg twice daily, Ativan 0.5 mg 4 times a day as needed for agitation, Tylenol 325 mg 2 tablets 3 times a day as needed for pain.   PHYSICAL EXAMINATION: VITAL SIGNS: Blood pressure of 147/66, pulse 98, respirations 22, temperature 98.4. The patient's oxygen saturation is 95% on room air.  GENERAL: Alert and oriented x 3. No acute distress. No respiratory distress. Hemodynamically stable.  HEENT: Pupils are equal and reactive. Extraocular movements are intact. Mucosae are moist. Anicteric sclerae. Pink conjunctivae. No oral lesions. No oropharyngeal exudates. No thrush.  NECK: Supple. No JVD. No thyromegaly. No adenopathy. No carotid bruits. No rigidity.  CARDIOVASCULAR: Irregularly irregular. No murmurs, rubs or gallops. No displacement of PMI. No tenderness to palpation of anterior chest wall.  ABDOMEN: Soft, nontender, nondistended. No hepatosplenomegaly. No masses. Bowel sounds are positive.  GENITAL: Negative for external lesions. The patient is wearing a diaper.  EXTREMITIES: No edema, cyanosis or clubbing. Pulses +2. Capillary refill less than 3.  SKIN: No rashes, petechiae. No icterus.  MUSCULOSKELETAL: No significant joint deformities or joint effusions. No tenderness to palpation of ankles.  PSYCHIATRIC: No significant agitation. Alert, oriented x 3.  NEUROLOGIC: Cranial nerves II through XII intact. Strength is 5 out of 5 in all 4 extremities.  VASCULAR: Capillary refill less than 3 seconds. Pulses +2.  LYMPHATIC: Negative for lymphadenopathy in neck or supraclavicular area.   LABORATORY, DIAGNOSTIC AND RADIOLOGICAL DATA: CT abdomen and pelvis as  mentioned above: Sigmoid colon colitis/possible diverticulitis, right parenchymal lymphadenopathy; recommend followup colonoscopy to exclude malignancy. Glucose 100, creatinine 0.68, sodium 138, potassium 3.6, CO2 of 34, albumin 3.3. White count is 14,000; hemoglobin is 12. INR is 2.2. C. diff is negative. Urinalysis negative for urinary infection. Lactic acid 1.8. EKG monitor shows atrial fibrillation, rate controlled.   ASSESSMENT AND PLAN: This is a very nice 81 year old female with history of multiple medical problems including congestive heart failure (systolic), hypertension, hyperlipidemia, history of previous diverticulitis, comes with failure to treat diverticulitis outpatient.  1.  Left lower quadrant abdominal pain secondary to diverticulitis: The patient has been taking Augmentin for the last 10 days, failure to treat. In the past, she has been treated with amoxicillin. She was not able to tolerate that and it did not clear her up, and then she was changed to amoxicillin plus Flagyl and she improved. At this moment since she has been taking Augmentin, we are going to stop that and start her on Invanz. Gastroenterology consultation to be obtained to figure out course of care. At this moment, the patient is under the impression that she is going to be admitted and discharged the next day. Unfortunately with the type of medication she is taking,  it is only intravenous. If we would like to send her back home on this medication, we will have to put in a PICC line. The other option  is to add Flagyl to the Augmentin, but as per gastroenterology preference, we have done Invanz and will just follow up with them.  2.  Elevation of white blood count with systemic inflammatory response syndrome: The patient meets criteria based on elevation of heart rate, elevation of white count above 15,000. This is likely secondary to the diverticulitis/colitis. The patient is treated on Invanz.  3.  Congestive heart  failure: The patient has systolic congestive heart failure, which is chronic. At this moment it is stable, well-compensated, ejection fraction 35% to 45%, no signs of exacerbation. The patient needs some fluids because she looks dry. We are going to monitor her closely.  4.  Atrial fibrillation: Continue Coumadin. Check PT-INR in the morning.  5.  Multiple allergies to food: Try to give her diet appropriately to that.  6.  Cerebrovascular accident: Continue treatment with Coumadin, as the patient has chronic atrial fibrillation.  7.  Peptic ulcer disease: Proton pump inhibitor.  CODE STATUS: The patient is a full code.   TIME SPENT: I spent about 45 minutes with this patient.    ____________________________ Felipa Furnace, MD rsg:jm D: 02/08/2013 18:31:21 ET T: 02/08/2013 19:08:33 ET JOB#: [id number]  cc: Felipa Furnace, MD, <Dictator> Daliyah Sramek Juanda Chance MD ELECTRONICALLY SIGNED 02/10/2013 0:40

## 2014-11-08 NOTE — H&P (Signed)
PATIENT NAME:  Brooke Brooks, [name] MR#:  [id number] DATE OF BIRTH:  08/01/1923  DATE OF ADMISSION:  01/18/2014  REFERRING PHYSICIAN: Dr. York CeriseForbach  PRIMARY CARE PHYSICIAN: Dr. Burnett ShengHedrick   CHIEF COMPLAINT: Fever.   HISTORY OF PRESENT ILLNESS: An 81 year old Caucasian female with history of atrial fibrillation, history of stroke, congestive heart failure, ejection fraction of 35%, presenting with fever. Describes fever of three days' duration with subjective chills as well as generalized malaise. Finally took her temperature today and noted it to be 103.5 degrees Fahrenheit. Called her PCP with the above findings, who advised her to present to the Emergency Department. She presently has no localizing illness or symptoms. Denies any cough, shortness of breath, GI symptoms, including abdominal pain or diarrhea. Denies any urinary symptoms. However, does complain of generalized weakness, fatigue.   REVIEW OF SYSTEMS:  CONSTITUTIONAL: Positive for fevers, chills, weakness, fatigue.  EYES: Denies blurred vision, double vision, eye pain.  EARS, NOSE, THROAT: Denies tinnitus, ear pain, hearing loss.  RESPIRATORY: Denies cough, wheeze, shortness of breath.  CARDIOVASCULAR: No chest pain, palpitations, edema.  GASTROINTESTINAL: Denies nausea, vomiting, diarrhea, abdominal pain.  GENITOURINARY: Denies dysuria, hematuria.  ENDOCRINE: Denies nocturia or thyroid problems.  HEMATOLOGY AND LYMPHATIC: Denies easy bruising or bleeding.  SKIN: Denies rashes or lesions.  MUSCULOSKELETAL: Denies pain in neck, back, shoulder, knees, hips or arthritic symptoms.  NEUROLOGIC: Denies paralysis, paresthesias  Otherwise, full review of systems performed by me is negative.   PAST MEDICAL HISTORY: CVA, anxiety, not otherwise specified, hyperlipidemia, congestive heart failure, ejection fraction 35%, coronary artery disease, hypertension, peptic ulcer disease, chronic obstructive pulmonary disease, atrial fibrillation.   SOCIAL  HISTORY: No alcohol, tobacco, or drug usage. Uses a walker for ambulation.   FAMILY HISTORY: Positive for hypertension.   ALLERGIES: Cipro, sulfa drugs, lactose as well as wheat.  HOME MEDICATIONS: Include acetaminophen 650 mg 2 tabs p.o. q.8h. as needed for fever or pain, losartan 50 mg p.o. b.i.d., digoxin 125 mcg p.o. daily, warfarin 2.5 mg p.o. daily, Ativan 0.5 mg p.o. 4 times daily as needed for anxiety, trazodone 150 mg p.o. at bedtime, Lomotil 0.5 mg as needed for diarrhea, Simvastatin 20 mg p.o. at bedtime, Coreg 25 mg p.o. b.i.d., Lasix 20 mg p.o. daily, potassium 20 mEq p.o. daily, pantoprazole 40 mg p.o. daily, folic acid 0.4 mg p.o. daily.   PHYSICAL EXAMINATION: VITAL SIGNS: Temperature 98, heart rate 102, respirations 24, blood pressure 132/67, saturation 88% on room air, currently 96% on 2 liters nasal cannula. Weight 60.3 kg, BMI 21.5. Of note, once again, temperature at home prior to acetaminophen 103.5 degrees Fahrenheit.  GENERAL: Well-nourished, well-developed, Caucasian female, currently in no acute distress.  HEAD: Normocephalic, atraumatic.  EYES: Pupils equal, round, reactive to light. Extraocular muscles intact. No scleral icterus.  MOUTH: Moist mucous membranes. Dentition intact. No abscess noted.  EARS, NOSE, AND THROAT: Clear without exudates are clear without exudates. No external lesions.  NECK: Supple. No thyromegaly. No nodules. No JVD.  PULMONARY: Clear to auscultation bilaterally, without wheezes, rales, rhonchi. No use of accessory muscles. Good respiratory effort. Chest nontender to palpation. CARDIOVASCULAR: S1, S2. Regular rate and rhythm. No murmurs, rubs, or gallops. No edema. Pedal pulses 2+ bilaterally.  GASTROINTESTINAL: Soft, nontender, nondistended. No masses. Positive bowel sounds. No hepatosplenomegaly.  MUSCULOSKELETAL: No swelling, clubbing, or edema. Range of motion full in all extremities.  NEUROLOGIC: Cranial nerves II to XII intact. No gross  focal deficits. Sensory intact. Reflexes intact.  SKIN: No ulcerations, lesions, rash or  cyanosis. Skin warm and dry. Turgor intact. PSYCHIATRIC: Mood and affect within normal limits. Patient awake, alert and oriented x3. Insight and judgment intact.   LABORATORY DATA: EKG performed: Normal sinus rhythm with left bundle branch block. Chest x-ray performed, revealing cardiomegaly as well as possible small left effusion. Remainder of laboratory data includes sodium 139, potassium 3.3, chloride 101, bicarbonate 28, BUN 12, creatinine 0.7, glucose 142, magnesium 1.7. LFTs: Albumin 3.2; otherwise within normal limits. WBC 15.5, hemoglobin 11.8, platelets of 157. Urinalysis negative for evidence of infection.   ASSESSMENT AND PLAN: An 81 year old Caucasian female presenting with fever.  1.  Sepsis, meeting septic criteria by temperature at home, heart rate, leukocytosis, respiratory rate on arrival. Unclear etiology at this time. Panculture, including blood and urine. Broad-spectrum antibiotics with vancomycin and Zosyn. Follow culture data and taper antibiotics when data returns.  2.  Hypokalemia. Replace potassium, goal of 4 to 5.  3.  Hypomagnesemia. Replace magnesium, goal of 2.  4.  Atrial fibrillation, currently in normal sinus rhythm. Continue warfarin.  5.  Coronary artery disease. Continue beta blockade, aspirin as well as statin therapy.  6.  Deep venous thrombosis prophylaxis therapy on warfarin.   CODE STATUS: The patient is do not resuscitate, as discussed with the patient and family at bedside.   TIME SPENT: 45 minutes.   ____________________________ Cletis Athens. Vinh Sachs, MD dkh:cg D: 01/18/2014 03:23:34 ET T: 01/18/2014 04:41:03 ET JOB#: [id number]  cc: Cletis Athens. Shanan Mcmiller, MD, <Dictator> Darcel Frane Synetta Shadow MD ELECTRONICALLY SIGNED 01/18/2014 20:35

## 2014-11-08 NOTE — Discharge Summary (Signed)
PATIENT NAME:  Brooke Brooks, MCVICAR MR#:  [id number] DATE OF BIRTH:  1924-06-19  DATE OF ADMISSION:  01/18/2014 DATE OF DISCHARGE:  01/30/2014   ADMITTING DIAGNOSIS:  Sepsis.    DISCHARGE DIAGNOSES:  1.  SIRS due to aspiration pneumonitis. 2. Acute respiratory failure duet o aspiration pneumonitis. 3.          A. fib, RVR due to respiratory failure.   4. Acute on chronic systolic congestive heart failure due to atrial fibrillation, rapid ventricular response.   5. Dysphagia.  6. Hypokalemia.  7. Hypomagnesemia.   8. Anxiety.  9. History of coronary artery disease. 10. Cardiomyopathy with ejection fraction of 35%. 11. History of stroke with left-sided weakness.   12. Peptic ulcer disease. 13. Hyperlipidemia. 14. Chronic obstructive pulmonary disease.  DISCHARGE CONDITION: Poor.  DISCHARGE MEDICATIONS: The patient is being discharged on warfarin 2.5 mg p.o. daily, pantoprazole 40 mg p.o. daily, potassium 20 mEq once daily, Lasix 20 mg orally daily, Ativan 0.5 mg 4 times daily, Tylenol 650 mg 2 tablets every 8 hours, folic acid 0.4 mg once daily, simvastatin 20 mg p.o. at bedtime,  trazodone 150 mg p.o. at bedtime, Lomotil 0.5 mg orally as needed, diltiazem extended release 300  mg p.o. daily, amoxicillin 500/125 mg 1 tablet twice daily, metoprolol tartrate 50 mg p.o. every 8 hours, Morphine 20 mg/ml concentrated solution 5 mL every 4 hours as needed.  OXYGEN: The patient is being discharged on oxygen at 6 liters per cannula as needed.  DIET: Low-salt, mechanical soft,  thin liquids, aspiration precautions. Follow up appointments,  none.  FOLLOWUP APPOINTMENTS: None. The patient is being discharged to [facility].   HISTORY OF PRESENT ILLNESS: Please refer to interim discharge summary dictated by Dr.  Jacques Navy on 01/24/2014.  The patient is an 81 year old Caucasian female with past medical history significant for stroke, dysphagia, cardiomyopathy who presents to the hospital on 01/18/2014  with fevers, as well as chills and feeling malaise. Her fevers were checked on the day of admission and was found to be 103.5. She was advised to come to the Emergency Room by her primary care physician.   On arrival to the Emergency Room, her temperature was 98, pulse was 102, respiratory rate was 24, blood pressure 132/67, saturation was 88% on room air.   EKG showed normal sinus rhythm with left bundle branch block.   Chest x-ray revealed cardiomyopathy, as well as a small left effusion.   The patient was admitted with the diagnosis of sepsis. She was initiated on broad-spectrum antibiotic therapy with vancomycin and Zosyn.   Initial admission laboratories done on 01/18/2014 revealed a sodium level of 3.3, magnesium 1.7, and a glucose level elevated at 142; otherwise, BMP was unremarkable. Liver enzymes revealed albumin level of 3.2, otherwise unremarkable. Cardiac enzymes x1 were within normal limits. The patient's white blood cell count was elevated at 15.5, hemoglobin was 11.8, platelet count 167,000. Coagulation panel, prothrombin time was 23.7 and INR was 2.2.   The patient's radiologic studies, chest x-ray done on 01/18/2014, as mentioned above, revealed cardiomegaly, as well as frank vascular congestion. The patient was admitted to the hospital with diagnosis of sepsis and she did have a CT scan of her chest done due to fever, shortness of breath, hypoxia and concerns for possible pneumonia. It revealed multifocal patchy ground glass opacities in the right lung suspicious for multifocal pneumonia.   CT scan was done on 01/19/2014. The patient was managed on antibiotic therapy; however, her condition did not  improve and she was noted to have progressively worsening shortness of breath requiring 6 liters of oxygen on 01/25/2014. At that point, the patient's chest x-ray was repeated and the patient was found to have worsening bilateral airspace disease and effusions.   She was evaluated by the  speech therapist, and because of concern of dysphagia and aspiration pneumonitis, she was advised special precautions. She underwent a modified barium swallowing study; however, her condition continued to worsen and she developed atrial fibrillation, RVR, and the rate was very difficult to control. She was evaluated by the cardiologist and followed by the cardiologist daily. She was consulted by palliative care, and the family decided on [facility].   She is being [facility] to [facility] today, on 01/30/2014, in poor condition on BIPAP.   On the day of discharge, temperature 97.9, pulse was 68, respiratory rate 16, blood pressure ranging from 96 to 102 systolic and 60s to 70s diastolic. O2 saturation was 85-93% on 6 liters of oxygen through nasal cannula at rest.   TIME SPENT: 40 minutes.    ____________________________ Katharina Caperima Gryffin Altice, MD rv:jr D: 01/30/2014 18:58:31 ET T: 01/30/2014 20:52:15 ET JOB#: [id number]  cc: Katharina Caperima Rajah Lamba, MD, <Dictator> Johnell Landowski MD ELECTRONICALLY SIGNED 02/12/2014 14:56

## 2014-11-08 NOTE — Discharge Summary (Signed)
PATIENT NAME:  Brooke Brooks, HOELZEL MR#:  [id number] DATE OF BIRTH:  06/05/1924  DATE OF ADMISSION:  01/18/2014 DATE OF DISCHARGE:    CONSULTANTS:  1. Speech therapy.  2. Dr. Darrold Junker from cardiology.  3. Dr. Sampson Goon from infectious disease   PRIMARY CARE PHYSICIAN: Dr. Burnett Sheng.   CHIEF COMPLAINT: Fever.   CURRENT DIAGNOSES:  1. Sepsis due to pneumonia.  2. Acute respiratory failure due to pneumonia as well as acute systolic congestive heart failure.  3. Hypokalemia.  4. Hypomagnesemia.  5. Atrial fibrillation with rapid ventricular response.  6. History of atrial fibrillation.  7. Coronary artery disease.  8. History of chronic systolic congestive heart failure.  9. Anxiety.  10. Supratherapeutic INR.  11. History of stroke with left-sided weakness.  12. Hyperlipidemia.  13. History of coronary artery disease.  14. Peptic ulcer disease.  15. History of chronic obstructive pulmonary disease.   CURRENT MEDICATIONS: Coreg 25 mg b.i.d., digoxin 125 mg daily, folic acid 400 mcg daily, lorazepam 0.5 mg q. 6 hours, pantoprazole 40 mg daily, simvastatin 20 mg at bedtime, trazodone 150 mg at bedtime, acetaminophen 1000 mg q. 6 hours, Augmentin 500 mg b.i.d., diltiazem 60 mg q. 6 h.    SIGNIFICANT LABS AND IMAGING: Initial BUN 12, creatinine 0.7, sodium 139, peak creatinine 1.39, last creatinine 1.29. LFTs on arrival showed albumin of 3.2, otherwise within normal limits. White count on arrival 15.5, hemoglobin 11.8, last hemoglobin 9.9. Initial INR 2.2. Blood cultures on arrival in addition to urine cultures no growth to date. Last INR 4.6. CT chest without contrast on 01/19/2014, showed multifocal patchy ground-glass opacities in the right lung suspicious for multifocal pneumonia, less likely asymmetric pulmonary edema, cardiomegaly with small bilateral pleural effusions, right greater than the left.  HISTORY OF PRESENT ILLNESS AND HOSPITAL COURSE: For full details of H and P, please see the  dictation on 01/18/2014 by Dr. Clint Guy, but this is an 81 year old female with history of chronic atrial fibrillation, stroke, CHF with EF of 35%, who came in with fever as high as 103.5, then called PCP and was advised to come to the ER. On admission, she denied having cough, shortness of breath, GI symptoms, or diarrhea. Was admitted to the hospitalist service, started on broad-spectrum antibiotics with vancomycin and Zosyn as she did have sepsis on arrival, but initially we could not isolate the source. She did have significant shortness of breath and acute respiratory failure on arrival, so we suspected a respiratory etiology and CAT scan was performed showing right-sided pneumonia.   In regards to the pneumonia, the patient was maintained on the above antibiotics, but later a third antibiotic was obtained, as the patient still had spiked a fever and infectious disease was consulted. At this point, the sepsis has resolved. She has had no significant fevers. White count has normalized and the sepsis resolved and per infectious disease, we can switch the antibiotics to Levaquin; however, the patient has severe intolerance to Cipro and we have been changed her to Augmentin.   In regards to the acute respiratory failure, she came in with oxygen saturation of 88% on room air and the patient did have bouts of acute systolic CHF with pneumonia likely contributing. At this point in regard to the CHF, she is compensated, but the pneumonia is becoming an issue for her. She is on nebulizers, inhalers and oxygen, and incentive spirometer has been ordered.   In regards to the CHF, she did get some Lasix but bumped her creatinine and  therefore we started on some gentle fluids. Acute kidney injury has resolved and she is no longer on fluids. The patient was seen by speech therapy for possible aspiration pneumonia. Per speech, she is likely is not aspirating but has significant GERD symptoms and she is on PPI.   In  regards to the atrial fibrillation, she has had bouts of atrial fibrillation with RVR and we are starting on diltiazem 30 mg q. 6 hours. Her hypoxemia and acute respiratory failure are driving heart rate up and per cardiology, the diltiazem has been increased to 60 mg q. 6 hours. At this point, CHF is compensated, acute kidney injury is resolved and we are waiting for improved heart rate. Furthermore, the patient had developed supratherapeutic INR and we are holding the Coumadin.   The patient is DO NOT RESUSCITATE.   TOTAL TIME SPENT: About 40 minutes.     ____________________________ Krystal EatonShayiq Kinza Gouveia, MD sa:lm D: 01/24/2014 15:57:10 ET T: 01/25/2014 04:24:45 ET JOB#: [id number]  cc: Krystal EatonShayiq Veida Spira, MD, <Dictator> Krystal EatonSHAYIQ Ragan Reale MD ELECTRONICALLY SIGNED 02/13/2014 15:02

## 2014-11-08 NOTE — Consult Note (Signed)
PATIENT NAME:  Brooke Brooks, Brooke Brooks MR#:  [id number] DATE OF BIRTH:  Apr 02, 1924  DATE OF CONSULTATION:  01/20/2014  CONSULTING PHYSICIAN:  Stann Mainlandavid P. Sampson GoonFitzgerald, MD  REQUESTING PHYSICIAN: Dr. Jacques NavyAhmadzia.  REASON FOR CONSULTATION: Fever and pneumonia.   HISTORY OF PRESENT ILLNESS: This is an 81 year old female who was admitted July 4th with symptoms of fever and weakness. She had not been feeling well for several days with fatigue and low grade chills. She then noted what sounds like rigors and took her temperature and was found to be 103.5. She came to the Emergency Room and was admitted. She has had no prior GI symptoms, urinary tract infections, or pneumonias recently. She had been in her usual state of health. She was admitted and continued to have fevers despite starting antibiotics. CT scan of the chest was done due to hypoxia and she did have multifocal pneumonia. We are consulted for further evaluation.   PAST MEDICAL HISTORY:  1. CVA.  2. Hyperlipidemia.  3. CHF, EF 35%.  4. Coronary artery disease.  5. Hypertension.  6. Peptic ulcer disease.  7. COPD.  8. Atrial fibrillation.  9. History of possible dysphagia related to prior CVA.   SOCIAL HISTORY: Lives with her daughter. No tobacco, alcohol or drugs. Is able to ambulate with a walker.   FAMILY HISTORY: Positive for hypertension.   ALLERGIES: SHE SAYS SHE IS ALLERGIC TO CIPRO, WHICH CAUSES GI UPSET, AND SULFA DRUGS.   REVIEW OF SYSTEMS: Eleven systems reviewed and negative except as per HPI.   ANTIBIOTICS SINCE ADMISSION: Include vancomycin and Zosyn. She was begun on azithromycin today.   PHYSICAL EXAMINATION:  VITAL SIGNS: T-max last 24 hours to 101.3, currently 98.5, pulse 73, blood pressure 128/77, respirations 18, saturation 96% on 4 liters.  GENERAL: She is pleasant, interactive, looks younger than her stated age.  HEENT: Pupils are equal, round, and reactive to light and accommodation. Extraocular movements are intact.   OROPHARYNX: Clear with no erythema or lesions.  NECK: Supple. No anterior cervical, posterior cervical, or supraclavicular lymphadenopathy.  HEART: Regular.  LUNGS: Crackles basilar.  ABDOMEN: Soft, nontender, nondistended. No hepatosplenomegaly.  EXTREMITIES: No clubbing, cyanosis or edema.  MUSCULOSKELETAL: No joint pain or swelling.  DERMATOLOGIC: No rash or lesions.  LABORATORY DATA: Renal function shows creatinine of 0.94, BUN 21, white count on admission was 15.5, down to 12.8 today. Admission neutrophil count was 13,500, hemoglobin today 11.5, platelets 164,000. Urine cultures and blood cultures were negative. UA was negative. Chest x-ray done on admission showed a cardiomegaly and vascular congestion. CT of the chest done July 5th without contrast shows multifocal patchy ground-glass opacities in the right lung suspicious for multi-focal pneumonia with small bilateral pleural effusions, right greater than left. There is cardiomegaly as well.   IMPRESSION: An 81 year old admitted with fevers and rigors and found to have on CT scanning a pneumonia. Clinically, she has stabilized over the last 24 hours despite the persistent fevers. Chest x-ray was reviewed with the daughter and does show extensive disease. I do have some concern for aspiration given her history of cerebrovascular accident and reports her daughter has said she has had issues with dysphagia in the past.   RECOMMENDATIONS:  1. Continue current antibiotics, can likely back off the levofloxacin in the next day or 2 orally.  2. Consider speech and swallow evaluation.  3. Thank you for the consultation. I will be glad to follow with you.   ____________________________ Stann Mainlandavid P. Sampson GoonFitzgerald, MD dpf:lt D: 01/20/2014 21:00:10 ET  T: 01/21/2014 02:05:40 ET JOB#: [id number]  cc: Stann Mainland. Sampson Goon, MD, <Dictator> Zahmir Lalla Sampson Goon MD ELECTRONICALLY SIGNED 02/03/2014 21:21

## 2014-11-08 NOTE — Consult Note (Signed)
PATIENT NAME:  Brooke Brooks, Brooke Brooks MR#:  [id number] DATE OF BIRTH:  1924-01-30  DATE OF CONSULTATION:  01/21/2014  REFERRING PHYSICIAN:  Dr. Jacques NavyAhmadzia  CONSULTING PHYSICIAN:  Marcina MillardAlexander Aniesha Haughn, MD  PRIMARY CARE PHYSICIAN: Dr. Burnett ShengHedrick  CARDIOLOGIST: Dr. Gwen PoundsKowalski  CHIEF COMPLAINT: Fever.   HISTORY OF PRESENT ILLNESS: The patient is an 81 year old female with history of chronic atrial fibrillation, prior stroke, congestive heart failure. She was in her usual state of health until 3 days prior to admission when she started to experience recurrent episodes of fever and chills and [facility]. Admission labs were notable for an elevated white count of 15,500. The patient was seen in consultation by Dr. Sampson GoonFitzgerald. Chest CT scan revealed evidence for multifocal pneumonia. The patient has been in atrial fibrillation with intermittent rapid ventricular rate. The patient is asymptomatic. Denies chest pain or palpitations.   PAST MEDICAL HISTORY: 1.  Atrial fibrillation.  2.  Stroke 3 years ago with residual left-sided weakness.  3.  Chronic systolic congestive heart failure with LV ejection fraction of 35%.  4.  Hypertension.  5.  Peptic ulcer disease.  6.  COPD.  MEDICATIONS: Losartan 50 mg b.i.d., digoxin 0.125 mg daily, warfarin 2.5 mg daily, simvastatin 20 mg at bedtime, carvedilol 25 mg b.i.d., furosemide 20 mg daily, potassium 20 mEq daily, acetaminophen 650 mg 2 tabs q. 8 p.r.n., Ativan 0.5 mg q.i.d. p.r.n., trazodone 150 mg at bedtime, Lomotil 0.5 mg p.r.n., potassium 20 mEq daily, pantoprazole 40 mg daily, folic acid 0.4 mg daily.   SOCIAL HISTORY: The patient lives with her daughter. She denies tobacco abuse.   FAMILY HISTORY: The patient reports mother died with a history of congestive heart failure.   REVIEW OF SYSTEMS: CONSTITUTIONAL: The patient has had fever and chills.  EYES: No blurry vision.  EARS: The patient has some hearing loss.  RESPIRATORY: The patient  has exertional dyspnea.  CARDIOVASCULAR: The patient denies chest pain or palpitations.  GASTROINTESTINAL: The patient has mild nausea.  GENITOURINARY: No dysuria or hematuria.  ENDOCRINE: No polyuria or polydipsia.  MUSCULOSKELETAL: No arthralgias or myalgias.  NEUROLOGICAL: The patient has some mild residual left-sided weakness.   PHYSICAL EXAMINATION: VITAL SIGNS: Blood pressure 97/57, pulse 73, respirations 16, temperature 98, pulse ox 92%. HEENT: Pupils equal and reactive to light and accommodation.  NECK: Supple without thyromegaly.  LUNGS: Clear.  HEART: Normal JVP. Normal PMI. Irregularly irregular rhythm. Normal S1, S2. No appreciable gallop, murmur, or rub.  ABDOMEN: Soft and nontender. Pulses were intact bilaterally.  MUSCULOSKELETAL: Normal muscle tone.  NEUROLOGIC: The patient is alert and oriented x3. Motor and sensory are both grossly intact.   IMPRESSION: An 81 year old female who presents with pneumonia, has chronic atrial fibrillation with intermittent rapid ventricular rate, although the patient appears to be asymptomatic. Episodes of tachycardia appear to be related to inhaler therapy for pneumonia and chronic obstructive pulmonary disease. The patient currently appears hemodynamically stable.   RECOMMENDATIONS: 1.  Agree with overall current therapy.  2.  Continue metoprolol and digoxin for rate control.  3.  If rate becomes more problematic, may consider adding Cardizem.  4.  Continue warfarin for stroke prevention.  5.  No further cardiac diagnostics at this time.   ____________________________ Marcina MillardAlexander Bao Coreas, MD ap:sb D: 01/21/2014 16:51:21 ET T: 01/21/2014 17:15:12 ET JOB#: [id number]  cc: Marcina MillardAlexander Amika Tassin, MD, <Dictator> Marcina MillardALEXANDER Alicia Seib MD ELECTRONICALLY SIGNED 02/25/2014 13:56

## 2015-02-23 IMAGING — CT CT CHEST W/O CM
2 of 4 series · 15 of 36 positions shown, 18 images · non-contrast
Comparison: Chest radiograph dated 01/18/2014. CTA chest dated
03/08/2013.

CLINICAL DATA: Increased shortness of breath, fever, evaluate for
pneumonia/CHF

EXAM:
CT CHEST WITHOUT CONTRAST
TECHNIQUE: Multidetector CT imaging of the chest was performed following the
standard protocol without IV contrast..

[Series 2: routine chest wo · axial · 0.55mm/px · z∈[-566,-280]mm · 12 of 63 slices shown, 15 images]
[im 3/63  mediastinal]
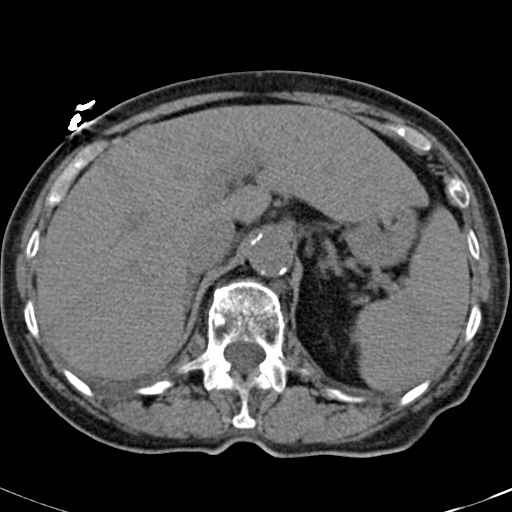
[im 3/63  lung]
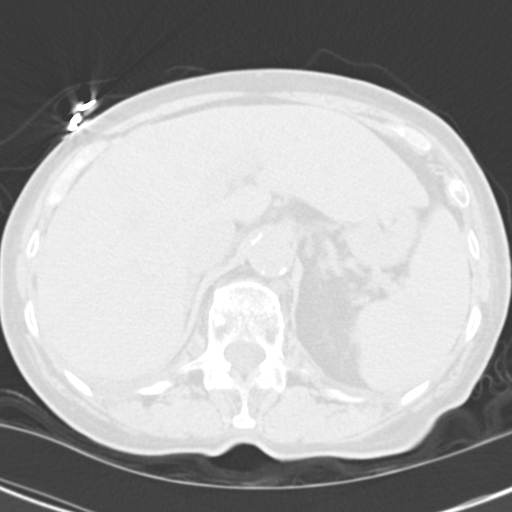
[im 9/63  lung]
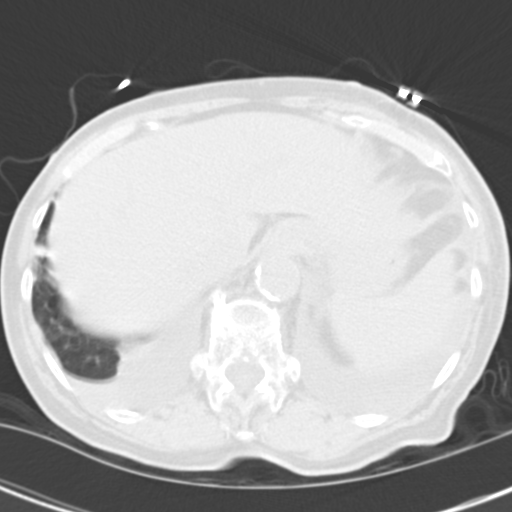
[im 15/63  lung]
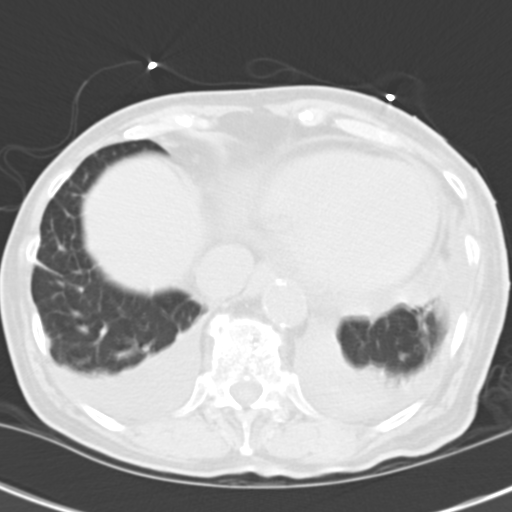
[im 20/63  lung]
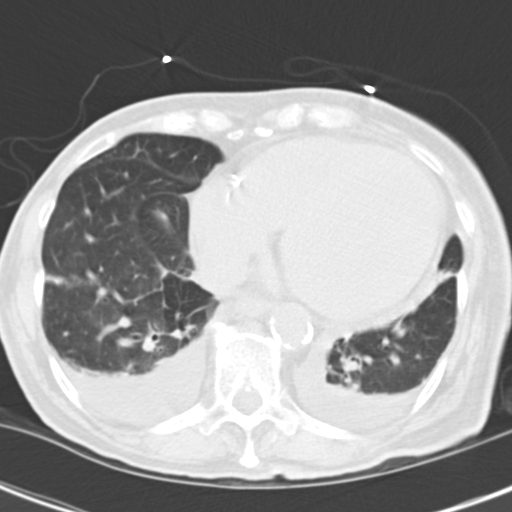
[im 23/63  mediastinal]
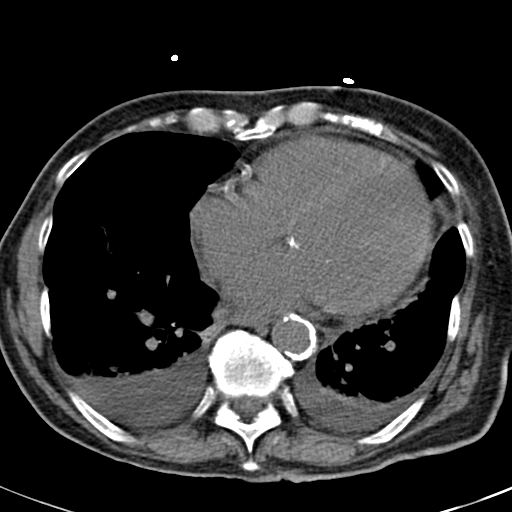
[im 23/63  lung]
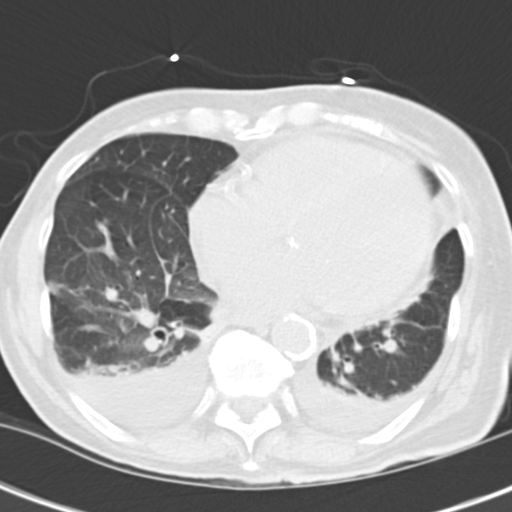
[im 29/63  lung]
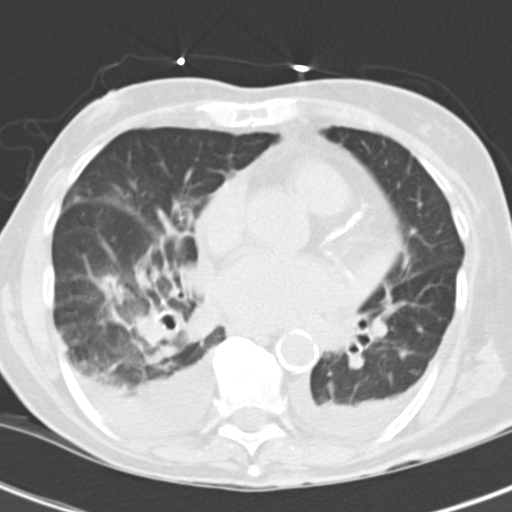
[im 34/63  lung]
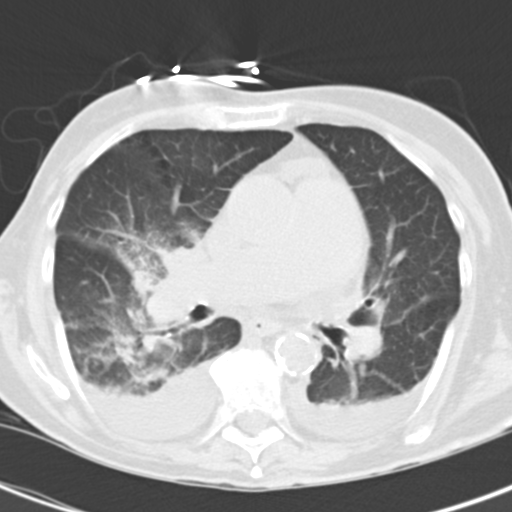
[im 40/63  lung]
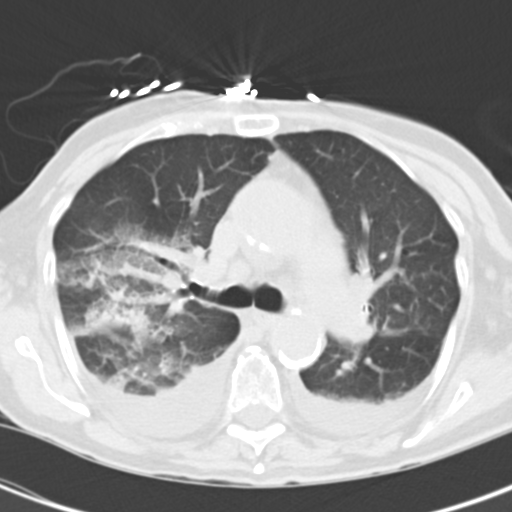
[im 43/63  mediastinal]
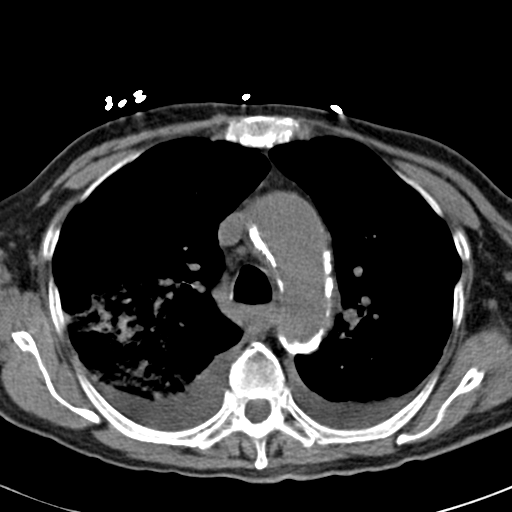
[im 43/63  lung]
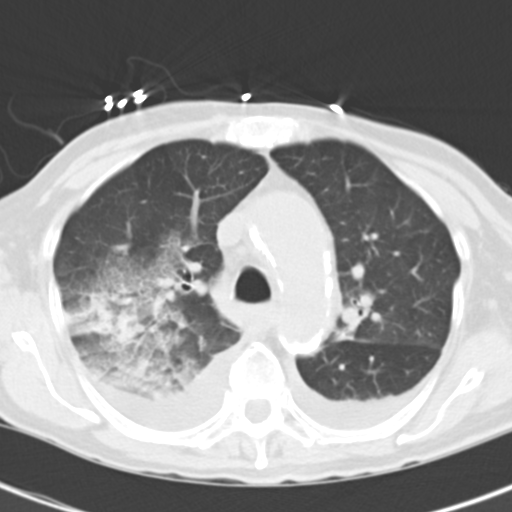
[im 48/63  lung]
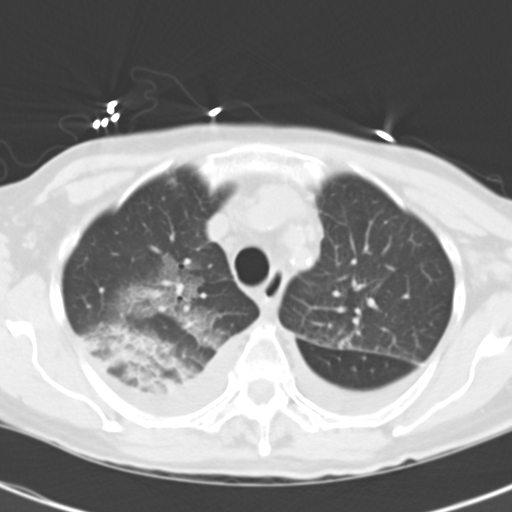
[im 54/63  lung]
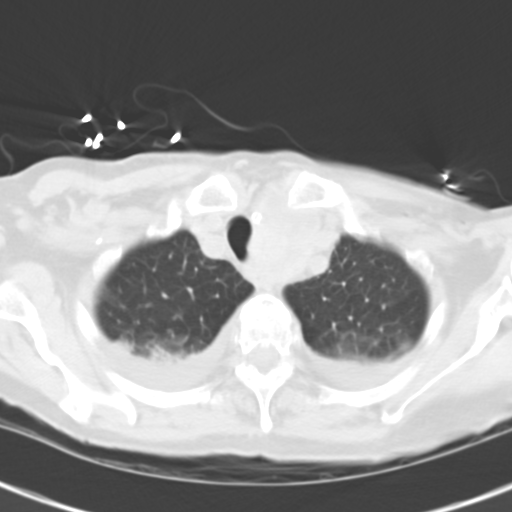
[im 60/63  lung]
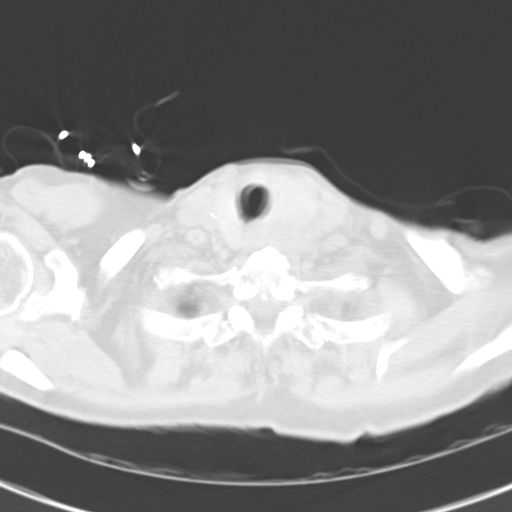

[Series 8: cor routine chest wo · coronal · 0.55mm/px · 3 of 111 slices shown]
[im 23/111  lung]
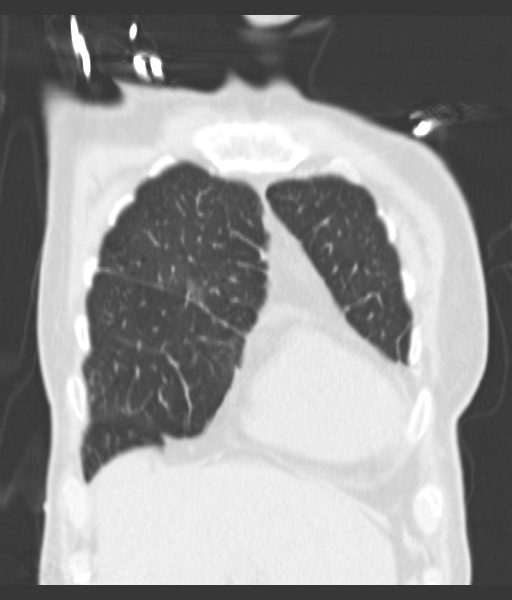
[im 45/111  lung]
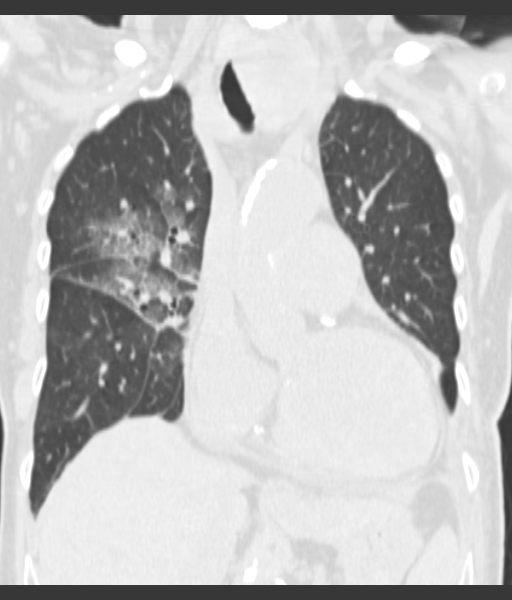
[im 67/111  lung]
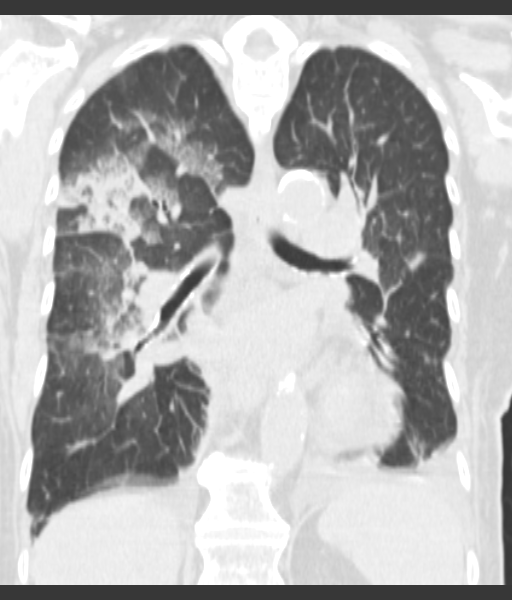

[15 of 36 positions shown; findings below may reference images not displayed]

FINDINGS: Multifocal patchy/ground-glass opacities in the right lung,
suspicious for multifocal pneumonia, less likely asymmetric
pulmonary edema. Small bilateral pleural effusions, right greater
than left. No suspicious pulmonary nodules. No pneumothorax.

Visualized thyroid is notable for a large left paratracheal goiter.

Cardiomegaly. Trace pericardial fluid. Coronary atherosclerosis.
Atherosclerotic calcifications of the aortic arch.

10 mm right paratracheal node (series 2/ image 23), likely reactive.

Visualized upper abdomen is grossly unremarkable.

Degenerative changes of the visualized thoracolumbar spine.
IMPRESSION: Multifocal patchy/ground-glass opacities in the right lung,
suspicious for multifocal pneumonia, less likely asymmetric
pulmonary edema.

Cardiomegaly with small bilateral pleural effusions, right greater
than left.

## 2015-03-03 IMAGING — CR DG CHEST 1V PORT
1 series · 1 of 1 positions shown · non-contrast
Comparison: Chest x-ray from 2 days prior

CLINICAL DATA: Shortness of breath

EXAM:
PORTABLE CHEST - 1 VIEW

[ap]
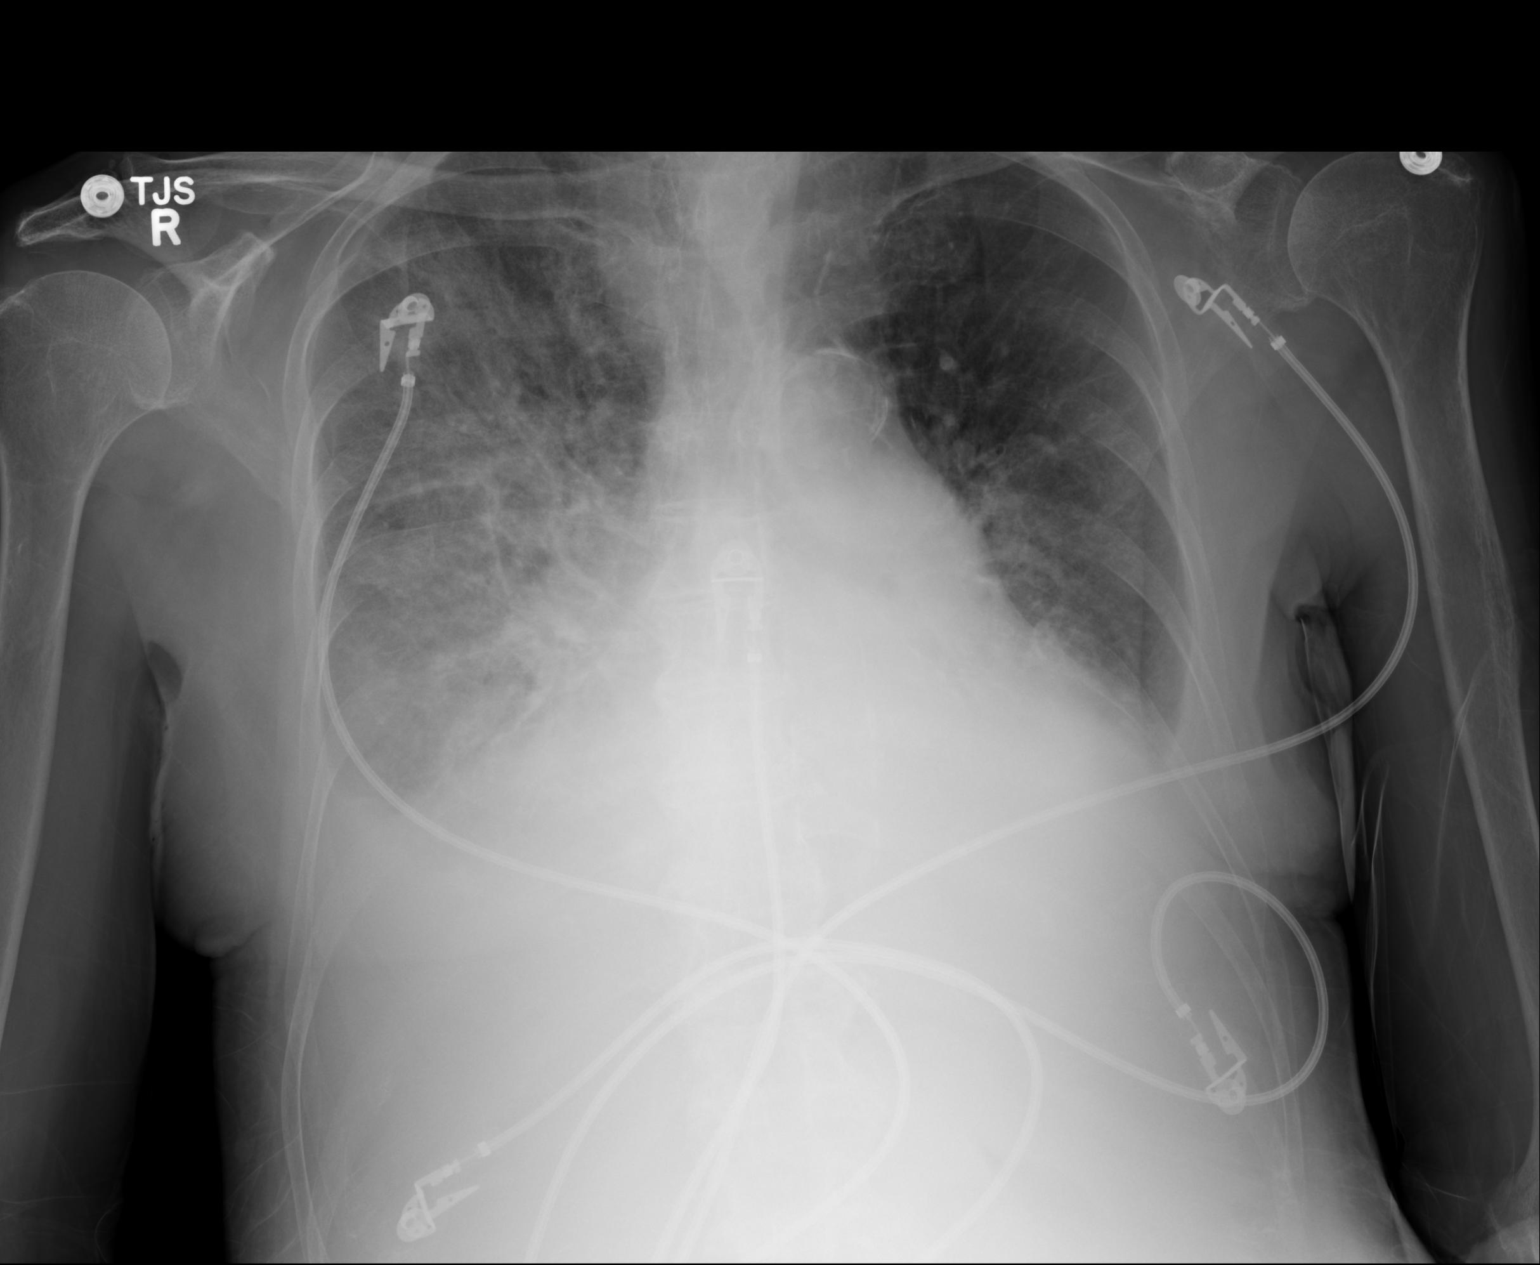

[1 of 1 positions shown; findings below may reference images not displayed]

FINDINGS: Chronic cardiopericardial enlargement. Pulmonary arterial
enlargement consistent with pulmonary hypertension. There is
rightward deviation of the trachea secondary to a left thyroid mass.

Asymmetric interstitial and airspace disease is stable in pattern
and extent, most dense on the right. Small bilateral pleural
effusions. No evidence of pneumothorax.
IMPRESSION: 1. Unchanged multi lobar pneumonia or asymmetric pulmonary edema.
2. Small bilateral pleural effusion.
